# Patient Record
Sex: Female | Born: 1950 | Race: White | Hispanic: No | Smoking: Never smoker
Health system: Southern US, Community
[De-identification: ages and names within clinical notes are randomized; demographics above are authoritative.]

## PROBLEM LIST (undated history)

## (undated) DIAGNOSIS — R569 Unspecified convulsions: Secondary | ICD-10-CM

## (undated) DIAGNOSIS — E785 Hyperlipidemia, unspecified: Secondary | ICD-10-CM

## (undated) DIAGNOSIS — K219 Gastro-esophageal reflux disease without esophagitis: Secondary | ICD-10-CM

## (undated) DIAGNOSIS — S0993XA Unspecified injury of face, initial encounter: Secondary | ICD-10-CM

## (undated) DIAGNOSIS — H903 Sensorineural hearing loss, bilateral: Secondary | ICD-10-CM

## (undated) DIAGNOSIS — N6019 Diffuse cystic mastopathy of unspecified breast: Secondary | ICD-10-CM

## (undated) DIAGNOSIS — K449 Diaphragmatic hernia without obstruction or gangrene: Secondary | ICD-10-CM

## (undated) HISTORY — DX: Unspecified convulsions: R56.9

## (undated) HISTORY — PX: BREAST LUMPECTOMY: SHX2

## (undated) HISTORY — DX: Hyperlipidemia, unspecified: E78.5

## (undated) HISTORY — DX: Diaphragmatic hernia without obstruction or gangrene: K44.9

## (undated) HISTORY — PX: ABDOMINAL HYSTERECTOMY: SHX81

## (undated) HISTORY — DX: Diffuse cystic mastopathy of unspecified breast: N60.19

## (undated) HISTORY — PX: CATARACT EXTRACTION, BILATERAL: SHX1313

## (undated) HISTORY — DX: Sensorineural hearing loss, bilateral: H90.3

## (undated) HISTORY — DX: Gastro-esophageal reflux disease without esophagitis: K21.9

## (undated) HISTORY — DX: Unspecified injury of face, initial encounter: S09.93XA

---

## 2004-09-24 HISTORY — PX: COLONOSCOPY W/ POLYPECTOMY: SHX1380

## 2004-11-28 ENCOUNTER — Ambulatory Visit: Payer: Self-pay | Admitting: Unknown Physician Specialty

## 2009-10-04 ENCOUNTER — Ambulatory Visit: Payer: Self-pay | Admitting: Ophthalmology

## 2010-07-10 ENCOUNTER — Ambulatory Visit: Payer: Self-pay | Admitting: Unknown Physician Specialty

## 2010-07-18 ENCOUNTER — Inpatient Hospital Stay: Payer: Self-pay | Admitting: Unknown Physician Specialty

## 2010-07-20 LAB — PATHOLOGY REPORT

## 2012-02-26 ENCOUNTER — Ambulatory Visit: Payer: Self-pay | Admitting: Ophthalmology

## 2013-06-11 ENCOUNTER — Emergency Department: Payer: Self-pay | Admitting: Emergency Medicine

## 2013-06-11 LAB — COMPREHENSIVE METABOLIC PANEL
Albumin: 3.5 g/dL (ref 3.4–5.0)
BUN: 5 mg/dL — ABNORMAL LOW (ref 7–18)
Bilirubin,Total: 0.3 mg/dL (ref 0.2–1.0)
Calcium, Total: 8.7 mg/dL (ref 8.5–10.1)
Creatinine: 0.61 mg/dL (ref 0.60–1.30)
EGFR (African American): >60
EGFR (Non-African Amer.): >60
Glucose: 123 mg/dL — ABNORMAL HIGH (ref 65–99)
Potassium: 3.7 mmol/L (ref 3.5–5.1)
SGOT(AST): 16 U/L (ref 15–37)
Total Protein: 6.8 g/dL (ref 6.4–8.2)

## 2013-06-11 LAB — TROPONIN I: Troponin-I: <0.02 ng/mL

## 2013-06-11 LAB — CBC
MCV: 85 fL (ref 80–100)
Platelet: 272 10*3/uL (ref 150–440)
RBC: 4.33 10*6/uL (ref 3.80–5.20)

## 2013-06-11 LAB — URINALYSIS, COMPLETE
Glucose,UR: NEGATIVE mg/dL (ref 0–75)
Ketone: NEGATIVE
Protein: NEGATIVE
Specific Gravity: 1.006 (ref 1.003–1.030)
WBC UR: <1 /HPF (ref 0–5)

## 2013-06-11 LAB — CK TOTAL AND CKMB (NOT AT ARMC): CK, Total: 126 U/L (ref 21–215)

## 2013-07-30 ENCOUNTER — Ambulatory Visit: Payer: Self-pay | Admitting: Family Medicine

## 2013-08-04 ENCOUNTER — Ambulatory Visit: Payer: Self-pay | Admitting: Family Medicine

## 2013-08-18 ENCOUNTER — Ambulatory Visit: Payer: Self-pay | Admitting: Family Medicine

## 2014-08-18 ENCOUNTER — Ambulatory Visit: Payer: Self-pay | Admitting: Family Medicine

## 2014-12-17 ENCOUNTER — Ambulatory Visit: Payer: Self-pay | Admitting: Neurology

## 2014-12-17 LAB — BASIC METABOLIC PANEL
Anion Gap: 7 (ref 7–16)
BUN: 11 mg/dL
CHLORIDE: 106 mmol/L
CO2: 27 mmol/L
CREATININE: 0.61 mg/dL
Calcium, Total: 9 mg/dL
Glucose: 112 mg/dL — ABNORMAL HIGH
Potassium: 4.1 mmol/L
Sodium: 140 mmol/L

## 2015-01-11 DIAGNOSIS — H903 Sensorineural hearing loss, bilateral: Secondary | ICD-10-CM | POA: Insufficient documentation

## 2015-01-11 DIAGNOSIS — H269 Unspecified cataract: Secondary | ICD-10-CM

## 2015-01-11 DIAGNOSIS — K449 Diaphragmatic hernia without obstruction or gangrene: Secondary | ICD-10-CM | POA: Insufficient documentation

## 2015-01-11 DIAGNOSIS — I10 Essential (primary) hypertension: Secondary | ICD-10-CM | POA: Insufficient documentation

## 2015-01-11 DIAGNOSIS — Z9842 Cataract extraction status, left eye: Secondary | ICD-10-CM

## 2015-01-11 DIAGNOSIS — Z9841 Cataract extraction status, right eye: Secondary | ICD-10-CM | POA: Insufficient documentation

## 2015-01-11 DIAGNOSIS — E785 Hyperlipidemia, unspecified: Secondary | ICD-10-CM

## 2015-01-16 NOTE — Op Note (Signed)
PATIENT NAME:  [name], Rosangelica S MR#:  [id number] DATE OF BIRTH:  1951-02-20  DATE OF PROCEDURE:  02/26/2012  PREOPERATIVE DIAGNOSIS: Visually significant cataract of the right eye.   POSTOPERATIVE DIAGNOSIS: Visually significant cataract of the right eye.   OPERATIVE PROCEDURE: Cataract extraction by phacoemulsification with implant of intraocular lens to right eye.   SURGEON: Galen ManilaWilliam Verdell Dykman, MD.   ANESTHESIA:  1. Managed anesthesia care.  2. Topical tetracaine drops followed by 2% Xylocaine jelly applied in the preoperative holding area.   COMPLICATIONS: None.   TECHNIQUE:  Stop and chop.   DESCRIPTION OF PROCEDURE: The patient was examined and consented in the preoperative holding area where the aforementioned topical anesthesia was applied to the right eye and then brought back to the Operating Room where the right eye was prepped and draped in the usual sterile ophthalmic fashion and a lid speculum was placed. A paracentesis was created with the side port blade and the anterior chamber was filled with viscoelastic. A near clear corneal incision was performed with the steel keratome. A continuous curvilinear capsulorrhexis was performed with a cystotome followed by the capsulorrhexis forceps. Hydrodissection and hydrodelineation were carried out with BSS on a blunt cannula. The lens was removed in a stop and chop technique and the remaining cortical material was removed with the irrigation-aspiration handpiece. The capsular bag was inflated with viscoelastic and the Technis ZCB00 20.5-diopter lens, serial number [id number] was placed in the capsular bag without complication. The remaining viscoelastic was removed from the eye with the irrigation-aspiration handpiece. The wounds were hydrated. The anterior chamber was flushed with Miostat and the eye was inflated to physiologic pressure. The wounds were found to be water tight. The eye was dressed with Vigamox. The patient was given protective  glasses to wear throughout the day and a shield with which to sleep tonight. The patient was also given drops with which to begin a drop regimen today and will follow-up with me in one day.   ____________________________ Jerilee FieldWilliam L. Vicky Mccanless, MD wlp:cms D: 02/26/2012 11:53:17 ET T: 02/26/2012 12:07:55 ET JOB#: [id number]  cc: Thelmer Legler L. Beula Joyner, MD, <Dictator> Jerilee FieldWILLIAM L Ravonda Brecheen MD ELECTRONICALLY SIGNED 02/27/2012 15:42

## 2015-05-13 ENCOUNTER — Ambulatory Visit (INDEPENDENT_AMBULATORY_CARE_PROVIDER_SITE_OTHER): Payer: Medicare Other | Admitting: Family Medicine

## 2015-05-13 ENCOUNTER — Encounter: Payer: Self-pay | Admitting: Family Medicine

## 2015-05-13 VITALS — BP 150/73 | HR 80 | Temp 99.1°F | Ht 63.0 in | Wt 176.0 lb

## 2015-05-13 DIAGNOSIS — J029 Acute pharyngitis, unspecified: Secondary | ICD-10-CM | POA: Insufficient documentation

## 2015-05-13 DIAGNOSIS — E785 Hyperlipidemia, unspecified: Secondary | ICD-10-CM | POA: Diagnosis not present

## 2015-05-13 DIAGNOSIS — R200 Anesthesia of skin: Secondary | ICD-10-CM

## 2015-05-13 DIAGNOSIS — R202 Paresthesia of skin: Secondary | ICD-10-CM | POA: Diagnosis not present

## 2015-05-13 DIAGNOSIS — I10 Essential (primary) hypertension: Secondary | ICD-10-CM

## 2015-05-13 DIAGNOSIS — H903 Sensorineural hearing loss, bilateral: Secondary | ICD-10-CM | POA: Diagnosis not present

## 2015-05-13 DIAGNOSIS — M79622 Pain in left upper arm: Secondary | ICD-10-CM

## 2015-05-13 NOTE — Assessment & Plan Note (Signed)
Check lipids fasting at f/u

## 2015-05-13 NOTE — Assessment & Plan Note (Signed)
Previous "catch" in left shoulder; might have arthritis; no discrete tendonitis appreciated; EKG done, unchanged from previous and symptoms do not mirror CAD/angina; will start with NCS/EMG and then consider further eval like shoulder imaging

## 2015-05-13 NOTE — Progress Notes (Signed)
BP 150/73 mmHg  Pulse 80  Temp(Src) 99.1 F (37.3 C)  Ht  (1.6 m)  Wt 176 lb (79.833 kg)  BMI 31.18 kg/m2  SpO2 97%   Subjective:    Patient ID: Stephanie Sexton, female    DOB: 04-04-1951, 64 y.o.   MRN: [id number]  HPI: Stephanie Sexton is a 64 y.o. female  Chief Complaint  Patient presents with  . Arm Pain    left arm x 1 month  . Sore Throat   She thinks she has a sinus infection; her nose is kind of stopped up; throat is a little sore; throat has been sore for three days; her daughter has had something, but forgot what it was; around 66 yo, 67 yo, and 64 yo Hurts to swallow; no fevers; no swollen glands Not blowing out much from the nose Little bit of cough from drainage She has been taking alka seltzer cold plus, vitamin C  For some time... She is wearing a magnetic bracelet; left hand would hurt and feel weak; was having weakness in the wrist for a few years; she does not know why the left wrist is weak; can mow the yard; uses push mower; she says weak, but then retracts that and says it's actually more pain; does not hurt to mow the yard; recently, she has numbness in her fingers for just a little, about a month or so; maybe 2nd and 3rd and 4th fingers; not the 5th fingers; thumb is not involved at all; grip is strong  About a month ago, she started to get pain along the back of the left arm, bought some cream and it just ached terrible; hurt worse than anything she has ever had; took advil and acetaminophen and that gave her relief; she did not take any yesterday, she'll get busy and forget; today it is just a little bit nagging; the pain can be debilitating; flexing the elbow and resting forearm above her head makes it feel better; no chest pain at all; no limited ROM but it does feel stiff in the left arm; used to catch when she would raise her left shoulder and arm, the catch is better, but the pain started after that catch happened  Relevant past medical, surgical,  family and social history reviewed and updated as indicated. Interim medical history since our last visit reviewed. Allergies and medications reviewed and updated.  Review of Systems  Constitutional: Negative for fever.  HENT: Positive for hearing loss (cannot hear as well as she used to hear; she has not had a hearing test; left = right), postnasal drip and sore throat. Negative for mouth sores, nosebleeds and trouble swallowing.   Respiratory: Negative for shortness of breath.   Cardiovascular: Negative for chest pain.  Gastrointestinal: Negative for nausea.  Skin: Negative for color change, pallor and rash.  Neurological: Positive for seizures (last seizure was July 3rd and 4th; saw neurologist and he increased the medicine on July 5th) and numbness (2nd, 3rd, 4th finger of left hand; she is right-handed). Negative for tremors and weakness.    Per HPI unless specifically indicated above     Objective:    BP 150/73 mmHg  Pulse 80  Temp(Src) 99.1 F (37.3 C)  Ht  (1.6 m)  Wt 176 lb (79.833 kg)  BMI 31.18 kg/m2  SpO2 97%  Wt Readings from Last 3 Encounters:  05/13/15 176 lb (79.833 kg)  10/28/14 177 lb (80.287 kg)    Physical  Exam  Constitutional: She appears well-developed and well-nourished. No distress.  HENT:  Head: Normocephalic and atraumatic.  Right Ear: No drainage. Tympanic membrane is not injected, not erythematous, not retracted and not bulging. No middle ear effusion.  Left Ear: No drainage. Tympanic membrane is injected and erythematous. Tympanic membrane is not scarred, not retracted and not bulging.  No middle ear effusion.  Nose: No rhinorrhea.  Mouth/Throat: Mucous membranes are not pale and not dry. Posterior oropharyngeal erythema present. No oropharyngeal exudate or posterior oropharyngeal edema.  Mild erythema/hyperemia of left drum  Eyes: EOM are normal. No scleral icterus.  Neck: No thyromegaly present.  Cardiovascular: Normal rate, regular rhythm  and normal heart sounds.   No murmur heard. Pulmonary/Chest: Effort normal and breath sounds normal. No respiratory distress. She has no wheezes.  Abdominal: Soft. Bowel sounds are normal. She exhibits no distension.  Musculoskeletal: Normal range of motion. She exhibits no edema.       Left shoulder: She exhibits normal range of motion, no swelling, no deformity and normal strength.       Left wrist: She exhibits normal range of motion, no tenderness, no bony tenderness, no swelling and no deformity.  Lymphadenopathy:    She has no cervical adenopathy.  Neurological: She is alert. She has normal strength. She displays no atrophy and no tremor. No sensory deficit. She exhibits normal muscle tone. She displays no seizure activity.  Skin: Skin is warm and dry. She is not diaphoretic. No pallor.  No palmar erythema  Psychiatric: She has a normal mood and affect. Her behavior is normal. Judgment and thought content normal.      Assessment & Plan:   Problem List Items Addressed This Visit      Cardiovascular and Mediastinum   Hypertension    Controlled today        Respiratory   Acute pharyngitis - Primary    Rapid strep was negative; throat culture pending; if grandchildren, other contacts get sick and her strep A comes back positive, they will need to be seen by their provider(s); rest, hydration, vit C, green tea      Relevant Orders   Strep Gp A Ag, IA W/Reflex     Nervous and Auditory   Sensorineural hearing loss of both ears    Evaluated by ENT January of 2016; she can return in the next few years if worsening        Other   Dyslipidemia    Check lipids fasting at f/u      Left upper arm pain    Previous "catch" in left shoulder; might have arthritis; no discrete tendonitis appreciated; EKG done, unchanged from previous and symptoms do not mirror CAD/angina; will start with NCS/EMG and then consider further eval like shoulder imaging      Relevant Orders   EKG  12-Lead (Completed)   Nerve conduction test   Numbness and tingling in left hand    Symptoms more suggestive of carpal tunnel than ulnar neuropathy, but not classic for CTS; will get nerve conduction studies to see if problem is median nerve or other; doubt thyroid or B12, but can check labs at f/u if nerve studies not helpful      Relevant Orders   EKG 12-Lead (Completed)   Nerve conduction test      Follow up plan: Return in about 3 weeks (around 06/03/2015) for fasting labs, visit.  An after-visit summary was printed and given to the patient at check-out.  Please  see the patient instructions which may contain other information and recommendations beyond what is mentioned above in the assessment and plan.

## 2015-05-13 NOTE — Addendum Note (Signed)
Addended byClaudine Mouton J on: 05/13/2015 02:43 PM   Modules accepted: SmartSet

## 2015-05-13 NOTE — Patient Instructions (Addendum)
Try to use PLAIN allergy or cold medicine without the decongestants when needed Avoid: phenylephrine, phenylpropanolamine, and pseudoephredine Coricidin is consider safer Try vitamin C (orange juice if not diabetic or vitamin C tablets) and drink green tea to help your immune system during your illness Get plenty of rest and hydration We'll contact you about the throat culture We'll get nerve conduction studies and then go from there If you have not heard anything from my staff in a week about any orders/referrals/studies from today, please contact us here to follow-up [phone] Okay to use acetaminophen Try turmeric as a natural anti-inflammatory (for pain and arthritis). It comes in capsules where you buy aspirin and fish oil, but also as a spice where you buy pepper and garlic powder. Return after the nerve studies and we'll decide about further work-up

## 2015-05-13 NOTE — Assessment & Plan Note (Signed)
Rapid strep was negative; throat culture pending; if grandchildren, other contacts get sick and her strep A comes back positive, they will need to be seen by their provider(s); rest, hydration, vit C, green tea

## 2015-05-13 NOTE — Assessment & Plan Note (Signed)
Symptoms more suggestive of carpal tunnel than ulnar neuropathy, but not classic for CTS; will get nerve conduction studies to see if problem is median nerve or other; doubt thyroid or B12, but can check labs at f/u if nerve studies not helpful

## 2015-05-13 NOTE — Assessment & Plan Note (Signed)
Evaluated by ENT January of 2016; she can return in the next few years if worsening

## 2015-05-13 NOTE — Assessment & Plan Note (Signed)
Controlled today 

## 2015-05-16 LAB — STREP GP A AG, IA W/REFLEX: STREP A CULTURE: NEGATIVE

## 2015-06-06 ENCOUNTER — Encounter: Payer: Self-pay | Admitting: Family Medicine

## 2015-06-06 ENCOUNTER — Ambulatory Visit (INDEPENDENT_AMBULATORY_CARE_PROVIDER_SITE_OTHER): Payer: Medicare Other | Admitting: Family Medicine

## 2015-06-06 VITALS — BP 133/86 | HR 65 | Temp 98.6°F | Ht 63.1 in | Wt 171.4 lb

## 2015-06-06 DIAGNOSIS — Z23 Encounter for immunization: Secondary | ICD-10-CM | POA: Diagnosis not present

## 2015-06-06 DIAGNOSIS — M79622 Pain in left upper arm: Secondary | ICD-10-CM

## 2015-06-06 DIAGNOSIS — R200 Anesthesia of skin: Secondary | ICD-10-CM

## 2015-06-06 DIAGNOSIS — R739 Hyperglycemia, unspecified: Secondary | ICD-10-CM

## 2015-06-06 DIAGNOSIS — G5602 Carpal tunnel syndrome, left upper limb: Secondary | ICD-10-CM

## 2015-06-06 DIAGNOSIS — E785 Hyperlipidemia, unspecified: Secondary | ICD-10-CM | POA: Diagnosis not present

## 2015-06-06 DIAGNOSIS — I10 Essential (primary) hypertension: Secondary | ICD-10-CM | POA: Diagnosis not present

## 2015-06-06 DIAGNOSIS — R202 Paresthesia of skin: Secondary | ICD-10-CM | POA: Diagnosis not present

## 2015-06-06 MED ORDER — WRIST BRACE/LEFT MEDIUM MISC: Status: AC

## 2015-06-06 NOTE — Progress Notes (Signed)
BP 133/86 mmHg  Pulse 65  Temp(Src) 98.6 F (37 C)  Ht 5' 3.1" (1.603 m)  Wt 171 lb 6.4 oz (77.747 kg)  BMI 30.26 kg/m2  SpO2 97%   Subjective:    Patient ID: Stephanie Sexton, female    DOB: [birth year], 64 y.o.   MRN: [id number]  HPI: Stephanie Sexton is a 64 y.o. female  Chief Complaint  Patient presents with  . Follow-up    pt states she still has some tingling in her left hand but it comes and goes. Mainly in thumb, index and middle finger.   She is here for follow-up; she had a banana today did not realize fasting; she has high cholesterol; eats just one or two eggs a week; does eat some cheese, not much; does eat hamburgers occasionally; no sausage for years; no milk except on cereal, uses whole milk (organic); using spelt for bread, higher fiber  She took the medicine and says the upper arm is all better; the thumb and index finger and middle finger; tingles some times; movement does not hurt, does not help; does not wake her from sleep; she is right-handed; she has not talked with her neurologist about it; she has not had nerve studies yet; staying off of the computer makes it better; she has not tried any OTCs; not using brace; tingling does not last long; again, the left upper arm pain is gone  Flu shot today  She has lost 6 pounds; not eating as much sweets; no goal; trying to eat healthier  Last seizure was July 4th, none since; goes back to see neurologist next month, October 28th; she knows no driving for six months after seizures  Relevant past medical, surgical, family and social history reviewed and updated as indicated. Interim medical history since our last visit reviewed.  Mother and brother both had diabetes Allergies and medications reviewed and updated.  Review of Systems  Constitutional: Negative for fever and unexpected weight change.  HENT: Negative for sore throat.   Eyes: Negative for visual disturbance.  Respiratory: Negative for wheezing.    Cardiovascular: Negative for chest pain.  Gastrointestinal: Negative for blood in stool.  Endocrine: Negative for polydipsia.  Genitourinary: Negative for hematuria.  Musculoskeletal: Negative for joint swelling and arthralgias.  Skin:       No worrisome skin changes; cherry angiomas and SKs around neckline  Allergic/Immunologic: Negative for food allergies.  Neurological: Positive for numbness (see HPI).  Hematological: Negative for adenopathy. Does not bruise/bleed easily.  Psychiatric/Behavioral:       More short-tempered from medicine from seizures  Per HPI unless specifically indicated above     Objective:    BP 133/86 mmHg  Pulse 65  Temp(Src) 98.6 F (37 C)  Ht 5' 3.1" (1.603 m)  Wt 171 lb 6.4 oz (77.747 kg)  BMI 30.26 kg/m2  SpO2 97%  Wt Readings from Last 3 Encounters:  06/06/15 171 lb 6.4 oz (77.747 kg)  05/13/15 176 lb (79.833 kg)  10/28/14 177 lb (80.287 kg)    Physical Exam  Constitutional: She appears well-developed and well-nourished. No distress.  HENT:  Head: Normocephalic and atraumatic.  Eyes: EOM are normal. No scleral icterus.  Neck: No thyromegaly present.  Cardiovascular: Normal rate, regular rhythm and normal heart sounds.   No murmur heard. Pulmonary/Chest: Effort normal and breath sounds normal. No respiratory distress. She has no wheezes.  Abdominal: Soft. Bowel sounds are normal. She exhibits no distension.  Musculoskeletal: Normal range of  motion. She exhibits no edema.  Neurological: She is alert. She exhibits normal muscle tone. Gait normal.  Episodic jerking movements of upper extremities; negative Phalen's and Tinel's test, but patient reported tingling shortly afterwards in the left hand along median nerve distribution, but not during the actual tests  Skin: Skin is warm and dry. No bruising noted. She is not diaphoretic. No pallor.  Scattered small cherry angiomas and brown keratotic papules around neckline  Psychiatric: She has a  normal mood and affect. Her behavior is normal. Judgment and thought content normal.  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    See AVS, educational info given; try brace; limit repetitive movements; considered EMG/NCS but patient opted to hold on that for now; she'll talk to her neurologist next month      Relevant Orders   TSH   Vitamin B12     Other   Dyslipidemia - Primary    Check fasting lipids later this week or next week; limit fried foods, simple carbs, sweets      Relevant Orders   Lipid Panel w/o Chol/HDL Ratio   Left upper arm pain    resolved      Numbness and tingling in left hand    Suspect carpal tunnel based on history; order nerve studies and try brace and have her take to neurologist      Hyperglycemia    Blood sugar in January was 124; recheck that along with an A1C later this week or next truly fasting; positive family hx of diabetes; praised weight loss and healthier eating      Relevant Orders   Comprehensive metabolic panel   Hgb A1c w/o eAG    Other Visit Diagnoses    Immunization due        flu vaccine given today    Relevant Orders    Flu vaccine greater than or equal to 3yo preservative free IM (Completed)       Follow up plan: Return in about 6 months (around 12/04/2015) for cholesterol and blood pressure; Medicare Wellness whenever due.  Orders Placed This Encounter  Procedures  . Flu vaccine greater than or equal to 3yo preservative free IM  . Comprehensive metabolic panel  . Hgb A1c w/o eAG  . Lipid Panel w/o Chol/HDL Ratio  . TSH  . Vitamin B12

## 2015-06-06 NOTE — Assessment & Plan Note (Signed)
See AVS, educational info given; try brace; limit repetitive movements; considered EMG/NCS but patient opted to hold on that for now; she'll talk to her neurologist next month

## 2015-06-06 NOTE — Assessment & Plan Note (Addendum)
Check fasting lipids later this week or next week; limit fried foods, simple carbs, sweets

## 2015-06-06 NOTE — Assessment & Plan Note (Addendum)
Blood sugar in January was 124; recheck that along with an A1C later this week or next truly fasting; positive family hx of diabetes; praised weight loss and healthier eating

## 2015-06-06 NOTE — Patient Instructions (Addendum)
Do consider cutting down the fat content of your milk Wear the brace as often as possible and limit repetitive motions with the left hand and wrist Talk with your neurologist if not improving with conservative therapy Return for labs next week at your convenience (okay to have medicines and water or other calorie-free beverages)   Carpal Tunnel Syndrome The carpal tunnel is a narrow area located on the palm side of your wrist. The tunnel is formed by the wrist bones and ligaments. Nerves, blood vessels, and tendons pass through the carpal tunnel. Repeated wrist motion or certain diseases may cause swelling within the tunnel. This swelling pinches the main nerve in the wrist (median nerve) and causes the painful hand and arm condition called carpal tunnel syndrome. CAUSES   Repeated wrist motions.  Wrist injuries.  Certain diseases like arthritis, diabetes, alcoholism, hyperthyroidism, and kidney failure.  Obesity.  Pregnancy. SYMPTOMS   A "pins and needles" feeling in your fingers or hand, especially in your thumb, index and middle fingers.  Tingling or numbness in your fingers or hand.  An aching feeling in your entire arm, especially when your wrist and elbow are bent for long periods of time.  Wrist pain that goes up your arm to your shoulder.  Pain that goes down into your palm or fingers.  A weak feeling in your hands. DIAGNOSIS  Your health care provider will take your history and perform a physical exam. An electromyography test may be needed. This test measures electrical signals sent out by your nerves into the muscles. The electrical signals are usually slowed by carpal tunnel syndrome. You may also need X-rays. TREATMENT  Carpal tunnel syndrome may clear up by itself. Your health care provider may recommend a wrist splint or medicine such as a nonsteroidal anti-inflammatory medicine. Cortisone injections may help. Sometimes, surgery may be needed to free the pinched  nerve.  HOME CARE INSTRUCTIONS   Take all medicine as directed by your health care provider. Only take over-the-counter or prescription medicines for pain, discomfort, or fever as directed by your health care provider.  If you were given a splint to keep your wrist from bending, wear it as directed. It is important to wear the splint at night. Wear the splint for as long as you have pain or numbness in your hand, arm, or wrist. This may take 1 to 2 months.  Rest your wrist from any activity that may be causing your pain. If your symptoms are work-related, you may need to talk to your employer about changing to a job that does not require using your wrist.  Put ice on your wrist after long periods of wrist activity.  Put ice in a plastic bag.  Place a towel between your skin and the bag.  Leave the ice on for 15-20 minutes, 03-04 times a day.  Keep all follow-up visits as directed by your health care provider. This includes any orthopedic referrals, physical therapy, and rehabilitation. Any delay in getting necessary care could result in a delay or failure of your condition to heal. SEEK IMMEDIATE MEDICAL CARE IF:   You have new, unexplained symptoms.  Your symptoms get worse and are not helped or controlled with medicines. MAKE SURE YOU:   Understand these instructions.  Will watch your condition.  Will get help right away if you are not doing well or get worse. Document Released: 09/07/2000 Document Revised: 01/25/2014 Document Reviewed: 07/27/2011 [facility] Patient Information 2015 [place], [place]. This information is not intended  to replace advice given to you by your health care provider. Make sure you discuss any questions you have with your health care provider.  DASH Eating Plan DASH stands for "Dietary Approaches to Stop Hypertension." The DASH eating plan is a healthy eating plan that has been shown to reduce high blood pressure (hypertension). Additional health benefits may  include reducing the risk of type 2 diabetes mellitus, heart disease, and stroke. The DASH eating plan may also help with weight loss. WHAT DO I NEED TO KNOW ABOUT THE DASH EATING PLAN? For the DASH eating plan, you will follow these general guidelines:  Choose foods with a percent daily value for sodium of less than 5% (as listed on the food label).  Use salt-free seasonings or herbs instead of table salt or sea salt.  Check with your health care provider or pharmacist before using salt substitutes.  Eat lower-sodium products, often labeled as "lower sodium" or "no salt added."  Eat fresh foods.  Eat more vegetables, fruits, and low-fat dairy products.  Choose whole grains. Look for the word "whole" as the first word in the ingredient list.  Choose fish and skinless chicken or [place] more often than red meat. Limit fish, poultry, and meat to 6 oz (170 g) each day.  Limit sweets, desserts, sugars, and sugary drinks.  Choose heart-healthy fats.  Limit cheese to 1 oz (28 g) per day.  Eat more home-cooked food and less restaurant, buffet, and fast food.  Limit fried foods.  Cook foods using methods other than frying.  Limit canned vegetables. If you do use them, rinse them well to decrease the sodium.  When eating at a restaurant, ask that your food be prepared with less salt, or no salt if possible. WHAT FOODS CAN I EAT? Seek help from a dietitian for individual calorie needs. Grains Whole grain or whole wheat bread. Brown rice. Whole grain or whole wheat pasta. Quinoa, bulgur, and whole grain cereals. Low-sodium cereals. Corn or whole wheat flour tortillas. Whole grain cornbread. Whole grain crackers. Low-sodium crackers. Vegetables Fresh or frozen vegetables (raw, steamed, roasted, or grilled). Low-sodium or reduced-sodium tomato and vegetable juices. Low-sodium or reduced-sodium tomato sauce and paste. Low-sodium or reduced-sodium canned vegetables.  Fruits All fresh,  canned (in natural juice), or frozen fruits. Meat and Other Protein Products Ground beef (85% or leaner), grass-fed beef, or beef trimmed of fat. Skinless chicken or [place]. Ground chicken or [place]. Pork trimmed of fat. All fish and seafood. Eggs. Dried beans, peas, or lentils. Unsalted nuts and seeds. Unsalted canned beans. Dairy Low-fat dairy products, such as skim or 1% milk, 2% or reduced-fat cheeses, low-fat ricotta or cottage cheese, or plain low-fat yogurt. Low-sodium or reduced-sodium cheeses. Fats and Oils Tub margarines without trans fats. Light or reduced-fat mayonnaise and salad dressings (reduced sodium). Avocado. Safflower, olive, or canola oils. Natural peanut or almond butter. Other Unsalted popcorn and pretzels. The items listed above may not be a complete list of recommended foods or beverages. Contact your dietitian for more options. WHAT FOODS ARE NOT RECOMMENDED? Grains White bread. White pasta. White rice. Refined cornbread. Bagels and croissants. Crackers that contain trans fat. Vegetables Creamed or fried vegetables. Vegetables in a cheese sauce. Regular canned vegetables. Regular canned tomato sauce and paste. Regular tomato and vegetable juices. Fruits Dried fruits. Canned fruit in light or heavy syrup. Fruit juice. Meat and Other Protein Products Fatty cuts of meat. Ribs, chicken wings, bacon, sausage, bologna, salami, chitterlings, fatback, hot dogs, bratwurst, and  packaged luncheon meats. Salted nuts and seeds. Canned beans with salt. Dairy Whole or 2% milk, cream, half-and-half, and cream cheese. Whole-fat or sweetened yogurt. Full-fat cheeses or blue cheese. Nondairy creamers and whipped toppings. Processed cheese, cheese spreads, or cheese curds. Condiments Onion and garlic salt, seasoned salt, table salt, and sea salt. Canned and packaged gravies. Worcestershire sauce. Tartar sauce. Barbecue sauce. Teriyaki sauce. Soy sauce, including reduced sodium. Steak  sauce. Fish sauce. Oyster sauce. Cocktail sauce. Horseradish. Ketchup and mustard. Meat flavorings and tenderizers. Bouillon cubes. Hot sauce. Tabasco sauce. Marinades. Taco seasonings. Relishes. Fats and Oils Butter, stick margarine, lard, shortening, ghee, and bacon fat. Coconut, palm kernel, or palm oils. Regular salad dressings. Other Pickles and olives. Salted popcorn and pretzels. The items listed above may not be a complete list of foods and beverages to avoid. Contact your dietitian for more information. WHERE CAN I FIND MORE INFORMATION? National Heart, Lung, and Blood Institute: [employer] Document Released: 08/30/2011 Document Revised: 01/25/2014 Document Reviewed: 07/15/2013 [facility] Patient Information 2015 [place], [place]. This information is not intended to replace advice given to you by your health care provider. Make sure you discuss any questions you have with your health care provider.

## 2015-06-06 NOTE — Assessment & Plan Note (Signed)
resolved 

## 2015-06-06 NOTE — Assessment & Plan Note (Signed)
Controlled today; limit salt, try to follow DASH guidelines, see AVS

## 2015-06-06 NOTE — Assessment & Plan Note (Signed)
Suspect carpal tunnel based on history; order nerve studies and try brace and have her take to neurologist

## 2015-07-12 ENCOUNTER — Telehealth: Payer: Self-pay

## 2015-07-12 NOTE — Telephone Encounter (Signed)
Called patient about her eye exam and colon cancer screening. She said that she knows she needs an eye exam, but hasn't been able to because she can't drive. But she has an appointment within the next month and she said that hopefully she'll get her license back and be able to to make her eye exam. [facility] Patient also said she wanted to do the cologuard instead of a colonoscopy.

## 2015-08-11 ENCOUNTER — Telehealth: Payer: Self-pay

## 2015-08-11 NOTE — Telephone Encounter (Signed)
Patient notified, she is still not allowed to drive due to her seizures so she has not been able to get in for labs since she is depending on a driver. She will be cleared after the first of the year and will come in then.

## 2015-08-12 ENCOUNTER — Telehealth: Payer: Self-pay | Admitting: Family Medicine

## 2015-08-12 NOTE — Telephone Encounter (Signed)
-----   Message from Elby ShowersAmy J Workman, [place] sent at 08/11/2015  5:04 PM EST ----- Regarding: RE: Please remind patient she is overdue for fasting labs; thank you She has not forgotten, but since she can't drive due to her seizures, she is stuck depending on people to take her. She wants to wait until after Jan 4th because then she'll be allowed to drive again. ----- Message -----    From: Kerman PasseyMelinda P Lada, MD    Sent: 08/10/2015   6:10 PM      To: Amy Charlyne PetrinJ Workman, CMA Subject: Please remind patient she is overdue for fas#

## 2015-12-06 ENCOUNTER — Ambulatory Visit: Payer: Medicare Other | Admitting: Family Medicine

## 2015-12-07 ENCOUNTER — Ambulatory Visit (INDEPENDENT_AMBULATORY_CARE_PROVIDER_SITE_OTHER): Payer: Medicare Other | Admitting: Family Medicine

## 2015-12-07 ENCOUNTER — Encounter: Payer: Self-pay | Admitting: Family Medicine

## 2015-12-07 VITALS — BP 116/69 | HR 66 | Temp 98.3°F | Ht 63.5 in | Wt 172.0 lb

## 2015-12-07 DIAGNOSIS — Z5181 Encounter for therapeutic drug level monitoring: Secondary | ICD-10-CM

## 2015-12-07 DIAGNOSIS — E538 Deficiency of other specified B group vitamins: Secondary | ICD-10-CM | POA: Diagnosis not present

## 2015-12-07 DIAGNOSIS — Z23 Encounter for immunization: Secondary | ICD-10-CM | POA: Diagnosis not present

## 2015-12-07 DIAGNOSIS — Z1231 Encounter for screening mammogram for malignant neoplasm of breast: Secondary | ICD-10-CM

## 2015-12-07 DIAGNOSIS — G5602 Carpal tunnel syndrome, left upper limb: Secondary | ICD-10-CM

## 2015-12-07 DIAGNOSIS — Z9842 Cataract extraction status, left eye: Secondary | ICD-10-CM

## 2015-12-07 DIAGNOSIS — E785 Hyperlipidemia, unspecified: Secondary | ICD-10-CM | POA: Diagnosis not present

## 2015-12-07 DIAGNOSIS — Z9841 Cataract extraction status, right eye: Secondary | ICD-10-CM | POA: Diagnosis not present

## 2015-12-07 DIAGNOSIS — R739 Hyperglycemia, unspecified: Secondary | ICD-10-CM | POA: Diagnosis not present

## 2015-12-07 DIAGNOSIS — K449 Diaphragmatic hernia without obstruction or gangrene: Secondary | ICD-10-CM

## 2015-12-07 DIAGNOSIS — I10 Essential (primary) hypertension: Secondary | ICD-10-CM

## 2015-12-07 NOTE — Assessment & Plan Note (Signed)
Continue SL B12 and check labs today

## 2015-12-07 NOTE — Assessment & Plan Note (Signed)
Limit eggs and cheese and saturated fats; check lipids today

## 2015-12-07 NOTE — Assessment & Plan Note (Signed)
Not causing significant symptoms; try to lose modest weight

## 2015-12-07 NOTE — Assessment & Plan Note (Signed)
Continue brace, good results

## 2015-12-07 NOTE — Assessment & Plan Note (Signed)
She'll call to make her own appt for routine f/u with eye doctor

## 2015-12-07 NOTE — Patient Instructions (Signed)
Keep up the great job limiting salt Try to limit your sweets and maybe lose about 10 pounds before our next visit We'll get labs today If you have not heard anything from my staff in a week about any orders/referrals/studies from today, please contact us here to follow-up [phone] You have received the Pneumovax vaccine (PPSV-23 and you will not need another booster of this for the rest of your life per current ACIP guidelines Return in 6 months

## 2015-12-07 NOTE — Assessment & Plan Note (Signed)
Check A1c; work on modest weight loss; cut back on sweets

## 2015-12-07 NOTE — Assessment & Plan Note (Signed)
Well-controlled today; continue same regimen; limit sodium, try to follow DASH guidelines

## 2015-12-07 NOTE — Assessment & Plan Note (Deleted)
She'll make her own appt to see eye doctor

## 2015-12-07 NOTE — Progress Notes (Signed)
BP 116/69 mmHg  Pulse 66  Temp(Src) 98.3 F (36.8 C)  Ht 5' 3.5" (1.613 m)  Wt 172 lb (78.019 kg)  BMI 29.99 kg/m2  SpO2 96%   Subjective:    Patient ID: Stephanie Sexton, female    DOB: 27-Dec-1950, 65 y.o.   MRN: [id number]  HPI: Stephanie Sexton is a 65 y.o. female  Chief Complaint  Patient presents with  . Hypertension    follow up and labs  . Hyperlipidemia    follow up and labs  . Referral    needs referral for colonoscopy; order placed for mammogram  . Immunizations    she is interested in the PPSV 23 vaccine   She is interested in getting the PPSV-23 today; she declines the Hep C testing (after counseling) Driving again, no seizures since March 28, 2015; sees neurologist Hypertension; well-controlled today; not checking away from doctor; not adding much salt, not reading labels for sodium content; husband had chronic HTN so she learned over the years to cook with less salt High cholesterol; not many eggs; sometimes occasional cheese, cheeseburgers occasionally; 2-3x a month; eats more chicken or [place]; plenty of fruits and veggies Carpal tunnel on the left; much better with brace; sleeps with it every night Prediabetes; mother had diabetes; few aunts, sister; pt will try to cut back on sweets She was due for labs 6 months ago and she says she couldn't come for labs because she says she couldn't drive; she knew she was supposed to come She is taking B12 for deficiency, discovered by Dr. Malvin Johns  Relevant past medical, surgical, family and social history reviewed and updated as indicated. Interim medical history since our last visit reviewed. Allergies and medications reviewed and updated.  Review of Systems  Gastrointestinal:       Eating yogurt and seldom has reflux; treated just occasionally with Tums; off of the acid medicine   Per HPI unless specifically indicated above     Objective:    BP 116/69 mmHg  Pulse 66  Temp(Src) 98.3 F (36.8 C)  Ht 5' 3.5"  (1.613 m)  Wt 172 lb (78.019 kg)  BMI 29.99 kg/m2  SpO2 96%  Wt Readings from Last 3 Encounters:  12/07/15 172 lb (78.019 kg)  06/06/15 171 lb 6.4 oz (77.747 kg)  05/13/15 176 lb (79.833 kg)    Physical Exam  Constitutional: She appears well-developed and well-nourished. No distress.  HENT:  Head: Normocephalic and atraumatic.  Eyes: EOM are normal. No scleral icterus.  Neck: No thyromegaly present.  Cardiovascular: Normal rate, regular rhythm and normal heart sounds.   No murmur heard. Pulmonary/Chest: Effort normal and breath sounds normal. No respiratory distress. She has no wheezes.  Abdominal: Soft. Bowel sounds are normal. She exhibits no distension.  Musculoskeletal: Normal range of motion. She exhibits no edema.  Neurological: She is alert. She exhibits normal muscle tone.  Grip 5/5; no thenar wasting  Skin: Skin is warm and dry. She is not diaphoretic. No pallor.  Psychiatric: She has a normal mood and affect. Her behavior is normal. Judgment and thought content normal.    Results for orders placed or performed in visit on 05/13/15  Strep Gp A Ag, IA W/Reflex  Result Value Ref Range   Strep A Culture Negative       Assessment & Plan:   Problem List Items Addressed This Visit      Cardiovascular and Mediastinum   Hypertension - Primary    Well-controlled today; continue  same regimen; limit sodium, try to follow DASH guidelines        Respiratory   Hiatal hernia    Not causing significant symptoms; try to lose modest weight        Digestive   Vitamin B12 deficiency    Continue SL B12 and check labs today      Relevant Orders   Vitamin B12     Nervous and Auditory   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    Continue brace, good results      Relevant Medications   levETIRAcetam (KEPPRA) 750 MG tablet     Other   H/O bilateral cataract extraction    She'll call to make her own appt for routine f/u with eye doctor      Dyslipidemia    Limit  eggs and cheese and saturated fats; check lipids today      Relevant Orders   Lipid Panel w/o Chol/HDL Ratio   Hyperglycemia    Check A1c; work on modest weight loss; cut back on sweets      Relevant Orders   Hgb A1c w/o eAG   Need for Streptococcus pneumoniae vaccination   Relevant Orders   Pneumococcal polysaccharide vaccine 23-valent greater than or equal to 2yo subcutaneous/IM (Completed)   Medication monitoring encounter   Relevant Orders   Comprehensive metabolic panel   CBC with Differential/Platelet    Other Visit Diagnoses    Visit for screening mammogram        Relevant Orders    MM SCREENING BREAST TOMO BILATERAL       Follow up plan: Return in about 6 months (around 06/08/2016) for visit and fasting labs.  Orders Placed This Encounter  Procedures  . MM SCREENING BREAST TOMO BILATERAL  . Pneumococcal polysaccharide vaccine 23-valent greater than or equal to 2yo subcutaneous/IM  . Lipid Panel w/o Chol/HDL Ratio  . Hgb A1c w/o eAG  . Comprehensive metabolic panel  . Vitamin B12  . CBC with Differential/Platelet   An after-visit summary was printed and given to the patient at check-out.  Please see the patient instructions which may contain other information and recommendations beyond what is mentioned above in the assessment and plan.

## 2015-12-08 LAB — CBC WITH DIFFERENTIAL/PLATELET
Basophils Absolute: 0 10*3/uL (ref 0.0–0.2)
Basos: 0 %
EOS (ABSOLUTE): 0.2 10*3/uL (ref 0.0–0.4)
EOS: 3 %
Hematocrit: 38.2 % (ref 34.0–46.6)
Hemoglobin: 12.4 g/dL (ref 11.1–15.9)
IMMATURE GRANULOCYTES: 0 %
Immature Grans (Abs): 0 10*3/uL (ref 0.0–0.1)
LYMPHS ABS: 1.6 10*3/uL (ref 0.7–3.1)
LYMPHS: 22 %
MCH: 27.7 pg (ref 26.6–33.0)
MCHC: 32.5 g/dL (ref 31.5–35.7)
MCV: 86 fL (ref 79–97)
MONOCYTES: 8 %
MONOS ABS: 0.6 10*3/uL (ref 0.1–0.9)
Neutrophils Absolute: 4.9 10*3/uL (ref 1.4–7.0)
Neutrophils: 67 %
PLATELETS: 266 10*3/uL (ref 150–379)
RBC: 4.47 x10E6/uL (ref 3.77–5.28)
RDW: 14.8 % (ref 12.3–15.4)
WBC: 7.3 10*3/uL (ref 3.4–10.8)

## 2015-12-08 LAB — COMPREHENSIVE METABOLIC PANEL WITH GFR
ALT: 8 [IU]/L (ref 0–32)
AST: 9 [IU]/L (ref 0–40)
Albumin/Globulin Ratio: 1.4 (ref 1.2–2.2)
Albumin: 3.9 g/dL (ref 3.6–4.8)
Alkaline Phosphatase: 96 [IU]/L (ref 39–117)
BUN/Creatinine Ratio: 15 (ref 11–26)
BUN: 9 mg/dL (ref 8–27)
Bilirubin Total: 0.3 mg/dL (ref 0.0–1.2)
CO2: 24 mmol/L (ref 18–29)
Calcium: 9.9 mg/dL (ref 8.7–10.3)
Chloride: 101 mmol/L (ref 96–106)
Creatinine, Ser: 0.6 mg/dL (ref 0.57–1.00)
GFR calc Af Amer: 110 mL/min/{1.73_m2}
GFR calc non Af Amer: 95 mL/min/{1.73_m2}
Globulin, Total: 2.7 g/dL (ref 1.5–4.5)
Glucose: 98 mg/dL (ref 65–99)
Potassium: 4.1 mmol/L (ref 3.5–5.2)
Sodium: 142 mmol/L (ref 134–144)
Total Protein: 6.6 g/dL (ref 6.0–8.5)

## 2015-12-08 LAB — LIPID PANEL W/O CHOL/HDL RATIO
Cholesterol, Total: 179 mg/dL (ref 100–199)
HDL: 41 mg/dL
LDL Calculated: 86 mg/dL (ref 0–99)
Triglycerides: 258 mg/dL — ABNORMAL HIGH (ref 0–149)
VLDL Cholesterol Cal: 52 mg/dL — ABNORMAL HIGH (ref 5–40)

## 2015-12-08 LAB — HGB A1C W/O EAG: Hgb A1c MFr Bld: 6 % — ABNORMAL HIGH (ref 4.8–5.6)

## 2015-12-08 LAB — VITAMIN B12

## 2016-06-13 ENCOUNTER — Ambulatory Visit
Admission: EM | Admit: 2016-06-13 | Discharge: 2016-06-13 | Disposition: A | Discharge status: Home / Self Care | Payer: Medicare Other | Attending: Family Medicine | Admitting: Family Medicine

## 2016-06-13 DIAGNOSIS — J0100 Acute maxillary sinusitis, unspecified: Secondary | ICD-10-CM | POA: Diagnosis not present

## 2016-06-13 MED ORDER — AMOXICILLIN 875 MG PO TABS: 875.0000 mg | ORAL_TABLET | Freq: Two times a day (BID) | ORAL | 0 refills | Status: AC

## 2016-06-13 NOTE — ED Provider Notes (Signed)
[facility]    CSN: [id number] Arrival date & time: 06/13/16  1133  First Provider Contact:  None       History   Chief Complaint Chief Complaint  Patient presents with  . Nasal Congestion    cough    HPI Stephanie Sexton is a 65 y.o. female.   The history is provided by the patient.  URI  Presenting symptoms: congestion, cough and rhinorrhea   Presenting symptoms: no fever   Severity:  Moderate Onset quality:  Sudden Duration:  1 week Timing:  Constant Progression:  Worsening Chronicity:  New Relieved by:  Nothing Ineffective treatments:  OTC medications Risk factors: not elderly, no chronic cardiac disease, no chronic kidney disease, no chronic respiratory disease, no diabetes mellitus, no immunosuppression, no recent illness, no recent travel and no sick contacts     Past Medical History:  Diagnosis Date  . Facial injury   . Fibrocystic breast changes   . GERD (gastroesophageal reflux disease)   . Hiatal hernia   . Hyperlipidemia   . Seizures (HCC)    managed by Dr. Sherryll Burger  . Sensorineural hearing loss of both ears     Patient Active Problem List   Diagnosis Date Noted  . Need for Streptococcus pneumoniae vaccination 12/07/2015  . Vitamin B12 deficiency 12/07/2015  . Medication monitoring encounter 12/07/2015  . Hyperglycemia 06/06/2015  . 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 Left upper arm pain 05/13/2015  . Numbness and tingling in left hand 05/13/2015  . H/O bilateral cataract extraction 01/11/2015  . Hypertension 01/11/2015  . Dyslipidemia 01/11/2015  . Hiatal hernia   . Sensorineural hearing loss of both ears     Past Surgical History:  Procedure Laterality Date  . ABDOMINAL HYSTERECTOMY     Total  . BREAST LUMPECTOMY Left   . CATARACT EXTRACTION, BILATERAL    . COLONOSCOPY W/ POLYPECTOMY  2006   Tubular Adenoma     OB History    No data available       Home Medications    Prior to Admission  medications   Medication Sig Start Date End Date Taking? Authorizing Provider  Apoaequorin (PREVAGEN) 10 MG CAPS Take by mouth daily.   Yes Historical Provider, MD  Ascorbic Acid (VITAMIN C) 100 MG tablet Take 500 mg by mouth daily.    Yes Historical Provider, MD  aspirin EC 81 MG tablet Take 81 mg by mouth daily.   Yes Historical Provider, MD  BIOTIN 5000 PO Take 5,000 mg by mouth daily.   Yes Historical Provider, MD  Cholecalciferol (VITAMIN D3 ADULT GUMMIES PO) Take by mouth daily.   Yes Historical Provider, MD  levETIRAcetam (KEPPRA) 750 MG tablet Take 750 mg by mouth 2 (two) times daily. 12/01/15  Yes Historical Provider, MD  Misc Natural Products (TURMERIC CURCUMIN) CAPS Take 1 capsule by mouth daily.   Yes Historical Provider, MD  Omega-3 Fatty Acids (FISH OIL) 1000 MG CAPS Take by mouth 2 (two) times daily.    Yes Historical Provider, MD  vitamin B-12 (CYANOCOBALAMIN) 100 MCG tablet Take 1,000 mcg by mouth 2 (two) times daily.    Yes Historical Provider, MD  amoxicillin (AMOXIL) 875 MG tablet Take 1 tablet (875 mg total) by mouth 2 (two) times daily. 06/13/16   Payton Mccallum, MD  Elastic Bandages & Supports (WRIST BRACE/LEFT MEDIUM) MISC Wear brace on the left wrist/hand daily while awake 06/06/15   Kerman Passey, MD  Family History Family History  Problem Relation Age of Onset  . Diabetes Mother   . Thyroid disease Mother   . Hypertension Mother   . Glaucoma Father   . Heart disease Brother   . Diabetes Brother   . Hypertension Brother   . Cancer Sister     colon and breast  . Thyroid disease Sister   . Seizures Paternal Aunt     Social History Social History  Substance Use Topics  . Smoking status: Never Smoker  . Smokeless tobacco: Never Used  . Alcohol use No     Allergies   Sulfa antibiotics   Review of Systems Review of Systems  Constitutional: Negative for fever.  HENT: Positive for congestion and rhinorrhea.   Respiratory: Positive for cough.       Physical Exam Triage Vital Signs ED Triage Vitals  Enc Vitals Group     BP 06/13/16 1224 130/70     Pulse Rate 06/13/16 1224 66     Resp 06/13/16 1224 18     Temp 06/13/16 1224 97.9 F (36.6 C)     Temp Source 06/13/16 1224 Oral     SpO2 06/13/16 1224 98 %     Weight 06/13/16 1224 180 lb (81.6 kg)     Height 06/13/16 1224 5\' 3"  (1.6 m)     Head Circumference --      Peak Flow --      Pain Score 06/13/16 1226 0     Pain Loc --      Pain Edu? --      Excl. in GC? --    No data found.   Updated Vital Signs BP 130/70 (BP Location: Left Arm)   Pulse 66   Temp 97.9 F (36.6 C) (Oral)   Resp 18   Ht 5\' 3"  (1.6 m)   Wt 180 lb (81.6 kg)   SpO2 98%   BMI 31.89 kg/m   Visual Acuity Right Eye Distance:   Left Eye Distance:   Bilateral Distance:    Right Eye Near:   Left Eye Near:    Bilateral Near:     Physical Exam  Constitutional: She appears well-developed and well-nourished. No distress.  HENT:  Head: Normocephalic and atraumatic.  Right Ear: Tympanic membrane, external ear and ear canal normal.  Left Ear: Tympanic membrane, external ear and ear canal normal.  Nose: Mucosal edema and rhinorrhea present. No nose lacerations, sinus tenderness, nasal deformity, septal deviation or nasal septal hematoma. No epistaxis.  No foreign bodies. Right sinus exhibits maxillary sinus tenderness and frontal sinus tenderness. Left sinus exhibits maxillary sinus tenderness and frontal sinus tenderness.  Mouth/Throat: Uvula is midline, oropharynx is clear and moist and mucous membranes are normal. No oropharyngeal exudate.  Eyes: Conjunctivae and EOM are normal. Pupils are equal, round, and reactive to light. Right eye exhibits no discharge. Left eye exhibits no discharge. No scleral icterus.  Neck: Normal range of motion. Neck supple. No thyromegaly present.  Cardiovascular: Normal rate, regular rhythm and normal heart sounds.   Pulmonary/Chest: Effort normal and breath sounds  normal. No respiratory distress. She has no wheezes. She has no rales.  Lymphadenopathy:    She has no cervical adenopathy.  Skin: She is not diaphoretic.  Nursing note and vitals reviewed.    UC Treatments / Results  Labs (all labs ordered are listed, but only abnormal results are displayed) Labs Reviewed - No data to display  EKG  EKG Interpretation None  Radiology No results found.  Procedures Procedures (including critical care time)  Medications Ordered in UC Medications - No data to display   Initial Impression / Assessment and Plan / UC Course  I have reviewed the triage vital signs and the nursing notes.  Pertinent labs & imaging results that were available during my care of the patient were reviewed by me and considered in my medical decision making (see chart for details).  Clinical Course      Final Clinical Impressions(s) / UC Diagnoses   Final diagnoses:  Acute maxillary sinusitis, recurrence not specified    New Prescriptions Discharge Medication List as of 06/13/2016 12:46 PM    START taking these medications   Details  amoxicillin (AMOXIL) 875 MG tablet Take 1 tablet (875 mg total) by mouth 2 (two) times daily., Starting Wed 06/13/2016, Normal       1. diagnosis reviewed with patient 2. rx as per orders above; reviewed possible side effects, interactions, risks and benefits  3. Recommend supportive treatment with otc flonase  4. Follow-up prn if symptoms worsen or don't improve   Payton Mccallum, MD 06/13/16 1249

## 2016-06-13 NOTE — ED Triage Notes (Signed)
Patient c/o cough, congestion for 3-4 days. She has been taking Claritin OTC.

## 2017-08-29 ENCOUNTER — Ambulatory Visit: Payer: Medicare Other | Attending: Neurology

## 2017-08-29 DIAGNOSIS — R0683 Snoring: Secondary | ICD-10-CM | POA: Diagnosis present

## 2017-08-29 DIAGNOSIS — R413 Other amnesia: Secondary | ICD-10-CM | POA: Diagnosis present

## 2018-06-17 ENCOUNTER — Ambulatory Visit (INDEPENDENT_AMBULATORY_CARE_PROVIDER_SITE_OTHER): Payer: Medicare Other

## 2018-06-17 VITALS — BP 116/60 | HR 65 | Temp 98.4°F | Ht 64.0 in | Wt 184.7 lb

## 2018-06-17 DIAGNOSIS — Z23 Encounter for immunization: Secondary | ICD-10-CM

## 2018-06-17 DIAGNOSIS — Z1211 Encounter for screening for malignant neoplasm of colon: Secondary | ICD-10-CM | POA: Diagnosis not present

## 2018-06-17 DIAGNOSIS — E2839 Other primary ovarian failure: Secondary | ICD-10-CM | POA: Diagnosis not present

## 2018-06-17 DIAGNOSIS — Z1231 Encounter for screening mammogram for malignant neoplasm of breast: Secondary | ICD-10-CM

## 2018-06-17 DIAGNOSIS — Z0000 Encounter for general adult medical examination without abnormal findings: Secondary | ICD-10-CM | POA: Diagnosis not present

## 2018-06-17 DIAGNOSIS — Z1239 Encounter for other screening for malignant neoplasm of breast: Secondary | ICD-10-CM

## 2018-06-17 NOTE — Patient Instructions (Signed)
Stephanie Sexton , Thank you for taking time to come for your Medicare Wellness Visit. I appreciate your ongoing commitment to your health goals. Please review the following plan we discussed and let me know if I can assist you in the future.   Screening recommendations/referrals: Colorectal Screening: You will receive a call from our office regarding your appointment Mammogram: Please call to schedule your appointment Bone Density: Please call to schedule your appointment  Vision and Dental Exams: Please call to schedule your appointment for an updated eye exam Recommended annual dental exams for proper oral hygiene  Vaccinations: Influenza vaccine: Completed today Pneumococcal vaccine: Up to date Tdap vaccine: Up to date Shingles vaccine: Please call your insurance company to determine your out of pocket expense for the Shingrix vaccine. You may receive this vaccine at your local pharmacy.  Advanced directives: Advance directives discussed with you today. I have provided a copy for you to complete at home and have notarized. Once this is complete please bring a copy in to our office so we can scan it into your chart.  Goals: Recommend to begin DASH diet as directed below  Next appointment: Please schedule your Annual Wellness Visit with your Nurse Health Advisor in one year.  Preventive Care 35 Years and Older, Female Preventive care refers to lifestyle choices and visits with your health care provider that can promote health and wellness. What does preventive care include?  A yearly physical exam. This is also called an annual well check.  Dental exams once or twice a year.  Routine eye exams. Ask your health care provider how often you should have your eyes checked.  Personal lifestyle choices, including:  Daily care of your teeth and gums.  Regular physical activity.  Eating a healthy diet.  Avoiding tobacco and drug use.  Limiting alcohol use.  Practicing safe  sex.  Taking low-dose aspirin every day if recommended by your health care provider.  Taking vitamin and mineral supplements as recommended by your health care provider. What happens during an annual well check? The services and screenings done by your health care provider during your annual well check will depend on your age, overall health, lifestyle risk factors, and family history of disease. Counseling  Your health care provider may ask you questions about your:  Alcohol use.  Tobacco use.  Drug use.  Emotional well-being.  Home and relationship well-being.  Sexual activity.  Eating habits.  History of falls.  Memory and ability to understand (cognition).  Work and work Astronomer.  Reproductive health. Screening  You may have the following tests or measurements:  Height, weight, and BMI.  Blood pressure.  Lipid and cholesterol levels. These may be checked every 5 years, or more frequently if you are over 54 years old.  Skin check.  Lung cancer screening. You may have this screening every year starting at age 76 if you have a 30-pack-year history of smoking and currently smoke or have quit within the past 15 years.  Fecal occult blood test (FOBT) of the stool. You may have this test every year starting at age 11.  Flexible sigmoidoscopy or colonoscopy. You may have a sigmoidoscopy every 5 years or a colonoscopy every 10 years starting at age 30.  Hepatitis C blood test.  Hepatitis B blood test.  Sexually transmitted disease (STD) testing.  Diabetes screening. This is done by checking your blood sugar (glucose) after you have not eaten for a while (fasting). You may have this done every 1-3  years.  Bone density scan. This is done to screen for osteoporosis. You may have this done starting at age 87.  Mammogram. This may be done every 1-2 years. Talk to your health care provider about how often you should have regular mammograms. Talk with your health  care provider about your test results, treatment options, and if necessary, the need for more tests. Vaccines  Your health care provider may recommend certain vaccines, such as:  Influenza vaccine. This is recommended every year.  Tetanus, diphtheria, and acellular pertussis (Tdap, Td) vaccine. You may need a Td booster every 10 years.  Zoster vaccine. You may need this after age 50.  Pneumococcal 13-valent conjugate (PCV13) vaccine. One dose is recommended after age 53.  Pneumococcal polysaccharide (PPSV23) vaccine. One dose is recommended after age 21. Talk to your health care provider about which screenings and vaccines you need and how often you need them. This information is not intended to replace advice given to you by your health care provider. Make sure you discuss any questions you have with your health care provider. Document Released: 10/07/2015 Document Revised: 05/30/2016 Document Reviewed: 07/12/2015 Elsevier Interactive Patient Education  2017 [place] Prevention in the Home Falls can cause injuries. They can happen to people of all ages. There are many things you can do to make your home safe and to help prevent falls. What can I do on the outside of my home?  Regularly fix the edges of walkways and driveways and fix any cracks.  Remove anything that might make you trip as you walk through a door, such as a raised step or threshold.  Trim any bushes or trees on the path to your home.  Use bright outdoor lighting.  Clear any walking paths of anything that might make someone trip, such as rocks or tools.  Regularly check to see if handrails are loose or broken. Make sure that both sides of any steps have handrails.  Any raised decks and porches should have guardrails on the edges.  Have any leaves, snow, or ice cleared regularly.  Use sand or salt on walking paths during winter.  Clean up any spills in your garage right away. This includes oil or  grease spills. What can I do in the bathroom?  Use night lights.  Install grab bars by the toilet and in the tub and shower. Do not use towel bars as grab bars.  Use non-skid mats or decals in the tub or shower.  If you need to sit down in the shower, use a plastic, non-slip stool.  Keep the floor dry. Clean up any water that spills on the floor as soon as it happens.  Remove soap buildup in the tub or shower regularly.  Attach bath mats securely with double-sided non-slip rug tape.  Do not have throw rugs and other things on the floor that can make you trip. What can I do in the bedroom?  Use night lights.  Make sure that you have a light by your bed that is easy to reach.  Do not use any sheets or blankets that are too big for your bed. They should not hang down onto the floor.  Have a firm chair that has side arms. You can use this for support while you get dressed.  Do not have throw rugs and other things on the floor that can make you trip. What can I do in the kitchen?  Clean up any spills right away.  Avoid walking on wet floors.  Keep items that you use a lot in easy-to-reach places.  If you need to reach something above you, use a strong step stool that has a grab bar.  Keep electrical cords out of the way.  Do not use floor polish or wax that makes floors slippery. If you must use wax, use non-skid floor wax.  Do not have throw rugs and other things on the floor that can make you trip. What can I do with my stairs?  Do not leave any items on the stairs.  Make sure that there are handrails on both sides of the stairs and use them. Fix handrails that are broken or loose. Make sure that handrails are as long as the stairways.  Check any carpeting to make sure that it is firmly attached to the stairs. Fix any carpet that is loose or worn.  Avoid having throw rugs at the top or bottom of the stairs. If you do have throw rugs, attach them to the floor with  carpet tape.  Make sure that you have a light switch at the top of the stairs and the bottom of the stairs. If you do not have them, ask someone to add them for you. What else can I do to help prevent falls?  Wear shoes that:  Do not have high heels.  Have rubber bottoms.  Are comfortable and fit you well.  Are closed at the toe. Do not wear sandals.  If you use a stepladder:  Make sure that it is fully opened. Do not climb a closed stepladder.  Make sure that both sides of the stepladder are locked into place.  Ask someone to hold it for you, if possible.  Clearly mark and make sure that you can see:  Any grab bars or handrails.  First and last steps.  Where the edge of each step is.  Use tools that help you move around (mobility aids) if they are needed. These include:  Canes.  Walkers.  Scooters.  Crutches.  Turn on the lights when you go into a dark area. Replace any light bulbs as soon as they burn out.  Set up your furniture so you have a clear path. Avoid moving your furniture around.  If any of your floors are uneven, fix them.  If there are any pets around you, be aware of where they are.  Review your medicines with your doctor. Some medicines can make you feel dizzy. This can increase your chance of falling. Ask your doctor what other things that you can do to help prevent falls. This information is not intended to replace advice given to you by your health care provider. Make sure you discuss any questions you have with your health care provider. Document Released: 07/07/2009 Document Revised: 02/16/2016 Document Reviewed: 10/15/2014  DASH Eating Plan DASH stands for "Dietary Approaches to Stop Hypertension." The DASH eating plan is a healthy eating plan that has been shown to reduce high blood pressure (hypertension). It may also reduce your risk for type 2 diabetes, heart disease, and stroke. The DASH eating plan may also help with weight  loss. What are tips for following this plan? General guidelines  Avoid eating more than 2,300 mg (milligrams) of salt (sodium) a day. If you have hypertension, you may need to reduce your sodium intake to 1,500 mg a day.  Limit alcohol intake to no more than 1 drink a day for nonpregnant women and 2 drinks a  day for men. One drink equals 12 oz of beer, 5 oz of wine, or 1 oz of hard liquor.  Work with your health care provider to maintain a healthy body weight or to lose weight. Ask what an ideal weight is for you.  Get at least 30 minutes of exercise that causes your heart to beat faster (aerobic exercise) most days of the week. Activities may include walking, swimming, or biking.  Work with your health care provider or diet and nutrition specialist (dietitian) to adjust your eating plan to your individual calorie needs. Reading food labels  Check food labels for the amount of sodium per serving. Choose foods with less than 5 percent of the Daily Value of sodium. Generally, foods with less than 300 mg of sodium per serving fit into this eating plan.  To find whole grains, look for the word "whole" as the first word in the ingredient list. Shopping  Buy products labeled as "low-sodium" or "no salt added."  Buy fresh foods. Avoid canned foods and premade or frozen meals. Cooking  Avoid adding salt when cooking. Use salt-free seasonings or herbs instead of table salt or sea salt. Check with your health care provider or pharmacist before using salt substitutes.  Do not fry foods. Cook foods using healthy methods such as baking, boiling, grilling, and broiling instead.  Cook with heart-healthy oils, such as olive, canola, soybean, or sunflower oil. Meal planning   Eat a balanced diet that includes: ? 5 or more servings of fruits and vegetables each day. At each meal, try to fill half of your plate with fruits and vegetables. ? Up to 6-8 servings of whole grains each day. ? Less than 6  oz of lean meat, poultry, or fish each day. A 3-oz serving of meat is about the same size as a deck of cards. One egg equals 1 oz. ? 2 servings of low-fat dairy each day. ? A serving of nuts, seeds, or beans 5 times each week. ? Heart-healthy fats. Healthy fats called Omega-3 fatty acids are found in foods such as flaxseeds and coldwater fish, like sardines, salmon, and mackerel.  Limit how much you eat of the following: ? Canned or prepackaged foods. ? Food that is high in trans fat, such as fried foods. ? Food that is high in saturated fat, such as fatty meat. ? Sweets, desserts, sugary drinks, and other foods with added sugar. ? Full-fat dairy products.  Do not salt foods before eating.  Try to eat at least 2 vegetarian meals each week.  Eat more home-cooked food and less restaurant, buffet, and fast food.  When eating at a restaurant, ask that your food be prepared with less salt or no salt, if possible. What foods are recommended? The items listed may not be a complete list. Talk with your dietitian about what dietary choices are best for you. Grains Whole-grain or whole-wheat bread. Whole-grain or whole-wheat pasta. Brown rice. Orpah Cobb. Bulgur. Whole-grain and low-sodium cereals. Pita bread. Low-fat, low-sodium crackers. Whole-wheat flour tortillas. Vegetables Fresh or frozen vegetables (raw, steamed, roasted, or grilled). Low-sodium or reduced-sodium tomato and vegetable juice. Low-sodium or reduced-sodium tomato sauce and tomato paste. Low-sodium or reduced-sodium canned vegetables. Fruits All fresh, dried, or frozen fruit. Canned fruit in natural juice (without added sugar). Meat and other protein foods Skinless chicken or [place]. Ground chicken or [place]. Pork with fat trimmed off. Fish and seafood. Egg whites. Dried beans, peas, or lentils. Unsalted nuts, nut butters, and seeds. Unsalted  canned beans. Lean cuts of beef with fat trimmed off. Low-sodium, lean deli  meat. Dairy Low-fat (1%) or fat-free (skim) milk. Fat-free, low-fat, or reduced-fat cheeses. Nonfat, low-sodium ricotta or cottage cheese. Low-fat or nonfat yogurt. Low-fat, low-sodium cheese. Fats and oils Soft margarine without trans fats. Vegetable oil. Low-fat, reduced-fat, or light mayonnaise and salad dressings (reduced-sodium). Canola, safflower, olive, soybean, and sunflower oils. Avocado. Seasoning and other foods Herbs. Spices. Seasoning mixes without salt. Unsalted popcorn and pretzels. Fat-free sweets. What foods are not recommended? The items listed may not be a complete list. Talk with your dietitian about what dietary choices are best for you. Grains Baked goods made with fat, such as croissants, muffins, or some breads. Dry pasta or rice meal packs. Vegetables Creamed or fried vegetables. Vegetables in a cheese sauce. Regular canned vegetables (not low-sodium or reduced-sodium). Regular canned tomato sauce and paste (not low-sodium or reduced-sodium). Regular tomato and vegetable juice (not low-sodium or reduced-sodium). Rosita Fire. Olives. Fruits Canned fruit in a light or heavy syrup. Fried fruit. Fruit in cream or butter sauce. Meat and other protein foods Fatty cuts of meat. Ribs. Fried meat. Tomasa Blase. Sausage. Bologna and other processed lunch meats. Salami. Fatback. Hotdogs. Bratwurst. Salted nuts and seeds. Canned beans with added salt. Canned or smoked fish. Whole eggs or egg yolks. Chicken or [place] with skin. Dairy Whole or 2% milk, cream, and half-and-half. Whole or full-fat cream cheese. Whole-fat or sweetened yogurt. Full-fat cheese. Nondairy creamers. Whipped toppings. Processed cheese and cheese spreads. Fats and oils Butter. Stick margarine. Lard. Shortening. Ghee. Bacon fat. Tropical oils, such as coconut, palm kernel, or palm oil. Seasoning and other foods Salted popcorn and pretzels. Onion salt, garlic salt, seasoned salt, table salt, and sea salt. Worcestershire  sauce. Tartar sauce. Barbecue sauce. Teriyaki sauce. Soy sauce, including reduced-sodium. Steak sauce. Canned and packaged gravies. Fish sauce. Oyster sauce. Cocktail sauce. Horseradish that you find on the shelf. Ketchup. Mustard. Meat flavorings and tenderizers. Bouillon cubes. Hot sauce and Tabasco sauce. Premade or packaged marinades. Premade or packaged taco seasonings. Relishes. Regular salad dressings. Where to find more information:  National Heart, Lung, and Blood Institute: PopSteam.is  American Heart Association: www.heart.org Summary  The DASH eating plan is a healthy eating plan that has been shown to reduce high blood pressure (hypertension). It may also reduce your risk for type 2 diabetes, heart disease, and stroke.  With the DASH eating plan, you should limit salt (sodium) intake to 2,300 mg a day. If you have hypertension, you may need to reduce your sodium intake to 1,500 mg a day.  When on the DASH eating plan, aim to eat more fresh fruits and vegetables, whole grains, lean proteins, low-fat dairy, and heart-healthy fats.  Work with your health care provider or diet and nutrition specialist (dietitian) to adjust your eating plan to your individual calorie needs. This information is not intended to replace advice given to you by your health care provider. Make sure you discuss any questions you have with your health care provider. Document Released: 08/30/2011 Document Revised: 09/03/2016 Document Reviewed: 09/03/2016 Elsevier Interactive Patient Education  2018 [employer].  Risk analyst Patient Education  [employer].

## 2018-06-17 NOTE — Progress Notes (Signed)
Subjective:   Stephanie Sexton is a 67 y.o. female who presents for an Initial Medicare Annual Wellness Visit.  Review of Systems    N/A  Cardiac Risk Factors include: advanced age (>47men, >9 women);dyslipidemia;hypertension;obesity (BMI >30kg/m2);sedentary lifestyle     Objective:    Today's Vitals   06/17/18 1033  BP: 116/60  Pulse: 65  Temp: 98.4 F (36.9 C)  TempSrc: Oral  SpO2: 96%  Weight: 184 lb 11.2 oz (83.8 kg)  Height: 5\' 4"  (1.626 m)   Body mass index is 31.7 kg/m.  Advanced Directives 06/17/2018  Does Patient Have a Medical Advance Directive? No  Would patient like information on creating a medical advance directive? Yes (MAU/Ambulatory/Procedural Areas - Information given)    Current Medications (verified) Outpatient Encounter Medications as of 06/17/2018  Medication Sig  . Apoaequorin (PREVAGEN) 10 MG CAPS Take by mouth daily.  . Ascorbic Acid (VITAMIN C) 100 MG tablet Take 500 mg by mouth daily.   Marland Kitchen aspirin EC 81 MG tablet Take 81 mg by mouth daily.  Marland Kitchen BIOTIN 5000 PO Take 5,000 mg by mouth daily.  . Cholecalciferol (VITAMIN D3 ADULT GUMMIES PO) Take by mouth daily.  Marland Kitchen levETIRAcetam (KEPPRA) 750 MG tablet Take 750 mg by mouth 2 (two) times daily.  . Misc Natural Products (TURMERIC CURCUMIN) CAPS Take 1 capsule by mouth daily.  . Omega-3 Fatty Acids (FISH OIL) 1000 MG CAPS Take by mouth 2 (two) times daily.   . vitamin B-12 (CYANOCOBALAMIN) 100 MCG tablet Take 1,000 mcg by mouth 2 (two) times daily.   Marland Kitchen amoxicillin (AMOXIL) 875 MG tablet Take 1 tablet (875 mg total) by mouth 2 (two) times daily.  Clinical research associate Bandages & Supports (WRIST BRACE/LEFT MEDIUM) MISC Wear brace on the left wrist/hand daily while awake   No facility-administered encounter medications on file as of 06/17/2018.     Allergies (verified) Sulfa antibiotics   History: Past Medical History:  Diagnosis Date  . Facial injury   . Fibrocystic breast changes   . GERD (gastroesophageal  reflux disease)   . Hiatal hernia   . Hyperlipidemia   . Seizures (HCC)    managed by Dr. Sherryll Sexton  . Sensorineural hearing loss of both ears    Past Surgical History:  Procedure Laterality Date  . ABDOMINAL HYSTERECTOMY     Total  . BREAST LUMPECTOMY Left   . CATARACT EXTRACTION, BILATERAL    . COLONOSCOPY W/ POLYPECTOMY  2006   Tubular Adenoma    Family History  Problem Relation Age of Onset  . Diabetes Mother   . Thyroid disease Mother   . Hypertension Mother   . Glaucoma Father   . Heart disease Brother   . Diabetes Brother   . Hypertension Brother   . Cancer Sister        colon and breast  . Thyroid disease Sister   . Seizures Paternal Aunt    Social History   Socioeconomic History  . Marital status: Widowed    Spouse name: Not on file  . Number of children: 1  . Years of education: Not on file  . Highest education level: Bachelor's degree (e.g., BA, AB, BS)  Occupational History  . Occupation: Retired  Engineer, production  . Financial resource strain: Not hard at all  . Food insecurity:    Worry: Never true    Inability: Never true  . Transportation needs:    Medical: No    Non-medical: No  Tobacco Use  .  Smoking status: Never Smoker  . Smokeless tobacco: Never Used  . Tobacco comment: smoking cessation materials not required  Substance and Sexual Activity  . Alcohol use: No  . Drug use: No  . Sexual activity: Never    Birth control/protection: Surgical  Lifestyle  . Physical activity:    Days per week: 0 days    Minutes per session: 0 min  . Stress: Not at all  Relationships  . Social connections:    Talks on phone: Patient refused    Gets together: Patient refused    Attends religious service: Patient refused    Active member of club or organization: Patient refused    Attends meetings of clubs or organizations: Patient refused    Relationship status: Widowed  Other Topics Concern  . Not on file  Social History Narrative  . Not on file     Tobacco Counseling Counseling given: No Comment: smoking cessation materials not required  Clinical Intake:  Pre-visit preparation completed: Yes  Pain : No/denies pain   BMI - recorded: 31.7 Nutritional Status: BMI > 30  Obese Nutritional Risks: None Diabetes: No  How often do you need to have someone help you when you read instructions, pamphlets, or other written materials from your doctor or pharmacy?: 1 - Never  Interpreter Needed?: No  Information entered by :: AEversole, LPN   Activities of Daily Living In your present state of health, do you have any difficulty performing the following activities: 06/17/2018  Hearing? N  Comment denies hearing aids  Vision? N  Comment wears eyeglasses  Difficulty concentrating or making decisions? Y  Comment short term memory loss  Walking or climbing stairs? N  Dressing or bathing? N  Doing errands, shopping? N  Preparing Food and eating ? N  Comment denies dentures  Using the Toilet? N  In the past six months, have you accidently leaked urine? N  Do you have problems with loss of bowel control? N  Managing your Medications? N  Managing your Finances? N  Housekeeping or managing your Housekeeping? N  Some recent data might be hidden     Immunizations and Health Maintenance Immunization History  Administered Date(s) Administered  . Influenza, High Dose Seasonal PF 06/17/2018  . Influenza, Seasonal, Injecte, Preservative Fre 06/06/2015  . Influenza-Unspecified 08/13/2014  . Pneumococcal Conjugate-13 08/31/2014  . Pneumococcal Polysaccharide-23 12/07/2015  . Td 06/08/2003  . Tdap 08/13/2014  . Zoster 06/20/2010   Health Maintenance Due  Topic Date Due  . Hepatitis C Screening  1951-01-10  . COLONOSCOPY  11/28/2009  . MAMMOGRAM  12/02/2013    Patient Care Team: Stephanie Passey, MD as PCP - General (Family Medicine) Stephanie Face, MD as Consulting Physician (Neurology)  Indicate any recent Medical Services  you may have received from other than [facility] providers in the past year (date may be approximate).     Assessment:   This is a routine wellness examination for Stephanie Sexton.  Hearing/Vision screen Vision Screening Comments: Sees [facility] for [facility]  Dietary issues and exercise activities discussed: Current Exercise Habits: The patient does not participate in regular exercise at present, Exercise limited by: None identified  Goals    . DIET - EAT MORE FRUITS AND VEGETABLES     Recommend to decrease portion sizes by eating 3 small healthy meals and at least 2 healthy snacks per day.      Depression Screen PHQ 2/9 Scores 06/17/2018  PHQ - 2 Score 0  PHQ- 9 Score 0    Fall Risk Fall Risk  06/17/2018  Falls in the past year? No  Risk for fall due to : Impaired vision  Risk for fall due to: Comment wears eyeglasses    FALL RISK PREVENTION PERTAINING TO THE HOME:  Any stairs in or around the home WITH handrails? Yes  Home free of loose throw rugs in walkways, pet beds, electrical cords, etc? Yes  Adequate lighting in your home to reduce risk of falls? Yes   ASSISTIVE DEVICES UTILIZED TO PREVENT FALLS:  Life alert? No  Use of a cane, walker or w/c? No  Grab bars in the bathroom? No  Shower chair or bench in shower? No  Elevated toilet seat or a handicapped toilet? No   DME ORDER AND COMMUNITY RESOURCE REFERRAL:  Does the patient want an order for a shower chair or an elevated toilet seat?  No  Does the patient want a Publishing rights managerCommunity Resource Referral sent to the Care Guide for a life alert or installation of grab bars in the shower?  No   TIMED UP AND GO:  Was the test performed? Yes .  Length of time to ambulate 10 feet: 12 sec.   GAIT:  Describe appearance of gait: Gait slow, steady and without the use of an assistive device.  Education: Fall risk prevention has been discussed.  Are interventions required? No   Cognitive Function:     6CIT Screen 06/17/2018   What Year? 0 points  What month? 0 points  What time? 0 points  Count back from 20 0 points  Months in reverse 0 points  Repeat phrase 4 points  Total Score 4    Screening Tests Health Maintenance  Topic Date Due  . Hepatitis C Screening  03-10-1951  . COLONOSCOPY  11/28/2009  . MAMMOGRAM  12/02/2013  . TETANUS/TDAP  08/13/2024  . INFLUENZA VACCINE  Completed  . DEXA SCAN  Completed  . PNA vac Low Risk Adult  Completed    Qualifies for Shingles Vaccine? Yes  Zostavax completed 06/20/10. Due for Shingrix. Education has been provided regarding the importance of this vaccine. Pt has been advised to call insurance company to determine out of pocket expense. Advised may also receive vaccine at local pharmacy or Health Dept. Verbalized acceptance and understanding.  Cancer Screenings:  Colorectal Screening: Completed 11/22/04. Repeat every 5 years. Referral to GI placed today. Pt aware that she will receive a call re: appointment.  Mammogram: Completed 12/02/12. Repeat every year. Ordered today. Pt provided with contact info and advised to call to schedule appt.   Bone Density: Completed 08/18/13. Results reflect NORMAL. Repeat every 2 years. Ordered today. Pt provided with contact info and advised to call to schedule appt.   Lung Cancer Screening: (Low Dose CT Chest recommended if Age 28-80 years, 30 pack-year currently smoking OR have quit w/in 15years.) does not qualify.   Additional Screening:  Hepatitis C Screening: does qualify but declined  Vision Screening: Recommended annual ophthalmology exams for early detection of glaucoma and other disorders of the eye. Is the patient up to date with their annual eye exam?  No. Pt declined my offer to place a referral for her to have an updated eye exam. Who is the provider or what is the name of the office in which the pt attends annual eye exams? [facility]  If pt is not established with a provider, would they like to be  referred to a provider to  establish care? No .   Dental Screening: Recommended annual dental exams for proper oral hygiene   Plan:  I have personally reviewed and addressed the Medicare Annual Wellness questionnaire and have noted the following in the patient's chart:  A. Medical and social history B. Use of alcohol, tobacco or illicit drugs  C. Current medications and supplements D. Functional ability and status E.  Nutritional status F.  Physical activity G. Advance directives H. List of other physicians I.  Hospitalizations, surgeries, and ER visits in previous 12 months J.  Vitals K. Screenings such as hearing and vision if needed, cognitive and depression L. Referrals and appointments  In addition, I have reviewed and discussed with patient certain preventive protocols, quality metrics, and best practice recommendations. A written personalized care plan for preventive services as well as general preventive health recommendations were provided to patient.  See attached scanned questionnaire for additional information.   Signed,  Deon Pilling, LPN Nurse Health Advisor

## 2018-07-16 ENCOUNTER — Encounter: Payer: Self-pay | Admitting: *Deleted

## 2019-04-02 ENCOUNTER — Other Ambulatory Visit: Payer: Self-pay | Admitting: Neurology

## 2019-04-02 ENCOUNTER — Other Ambulatory Visit (HOSPITAL_COMMUNITY): Payer: Self-pay | Admitting: Neurology

## 2019-04-02 DIAGNOSIS — G3184 Mild cognitive impairment, so stated: Secondary | ICD-10-CM

## 2019-04-10 ENCOUNTER — Other Ambulatory Visit: Payer: Self-pay

## 2019-04-10 ENCOUNTER — Ambulatory Visit (HOSPITAL_COMMUNITY)
Admission: RE | Admit: 2019-04-10 | Discharge: 2019-04-10 | Disposition: A | Discharge status: Home / Self Care | Payer: Medicare Other | Source: Ambulatory Visit | Attending: Neurology | Admitting: Neurology

## 2019-04-10 DIAGNOSIS — G3184 Mild cognitive impairment, so stated: Secondary | ICD-10-CM | POA: Diagnosis not present

## 2019-06-25 ENCOUNTER — Ambulatory Visit: Payer: Medicare Other

## 2019-08-26 IMAGING — MR MRI HEAD WITHOUT CONTRAST
3 series · 48 of 48 positions shown · non-contrast
Comparison: None.

CLINICAL DATA: Worsening memory loss.

EXAM:
MRI HEAD WITHOUT CONTRAST
TECHNIQUE: Multiplanar, multiecho pulse sequences of the brain and surrounding
structures were obtained without intravenous contrast.

[Series 52: nqsegcb_sc_cor · 1.00mm/px · 17 of 241 slices shown]
[im 1/241]
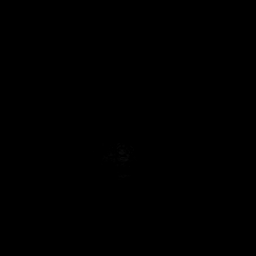
[im 16/241]
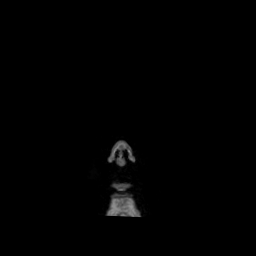
[im 31/241]
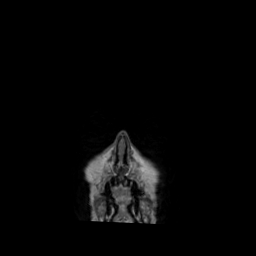
[im 46/241]
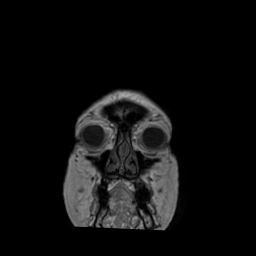
[im 61/241]
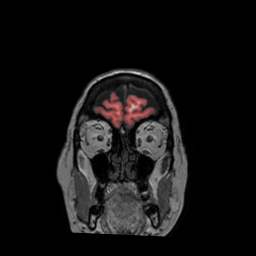
[im 76/241]
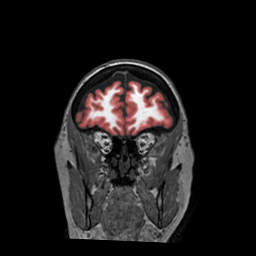
[im 91/241]
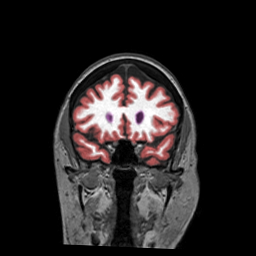
[im 106/241]
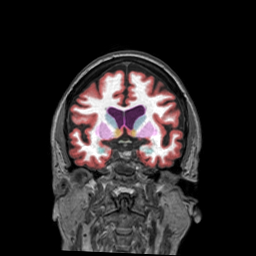
[im 121/241]
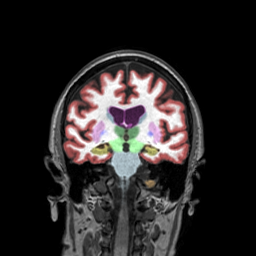
[im 136/241]
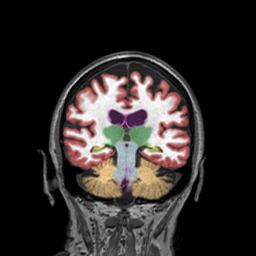
[im 151/241]
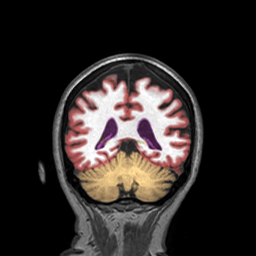
[im 166/241]
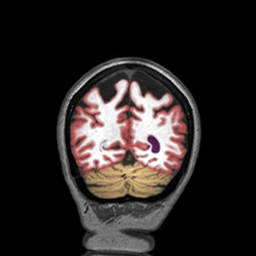
[im 181/241]
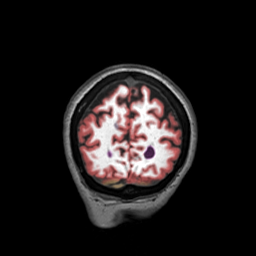
[im 196/241]
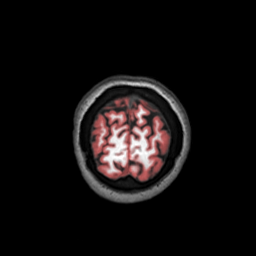
[im 211/241]
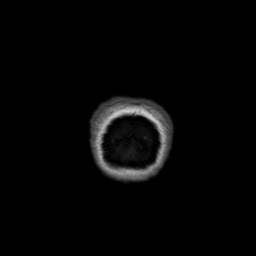
[im 226/241]
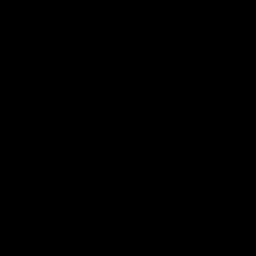
[im 241/241]
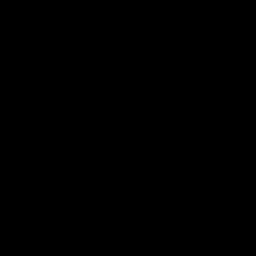

[Series 53: nqsegcb_sc_axl · 1.00mm/px · 16 of 218 slices shown]
[im 1/218]
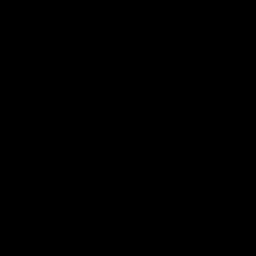
[im 15/218]
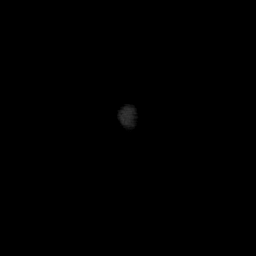
[im 29/218]
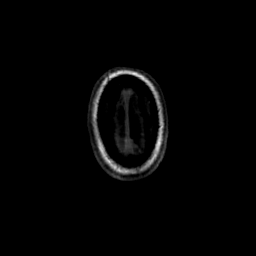
[im 44/218]
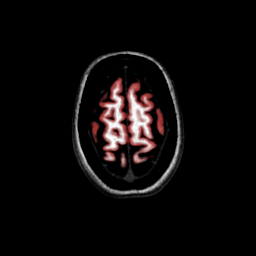
[im 58/218]
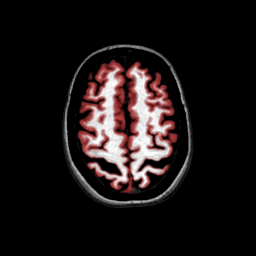
[im 73/218]
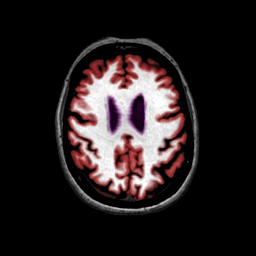
[im 87/218]
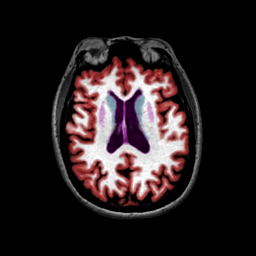
[im 102/218]
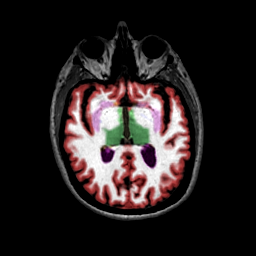
[im 116/218]
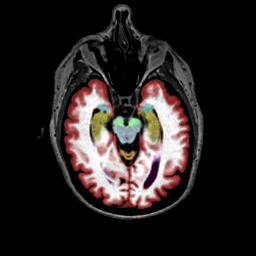
[im 131/218]
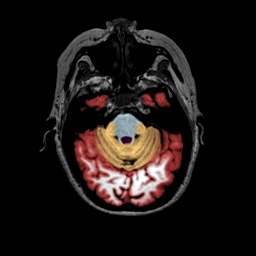
[im 145/218]
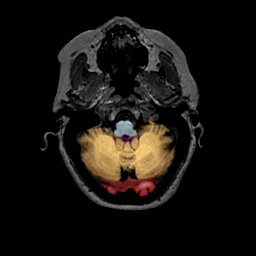
[im 160/218]
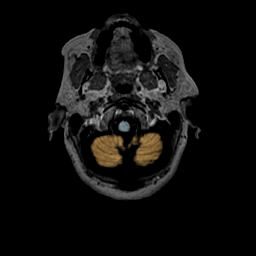
[im 174/218]
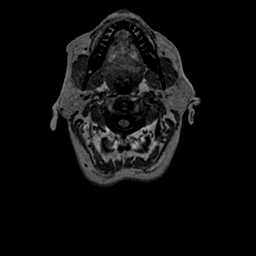
[im 189/218]
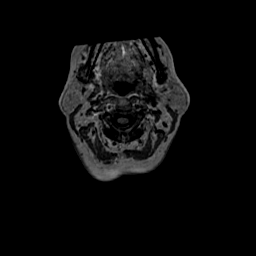
[im 203/218]
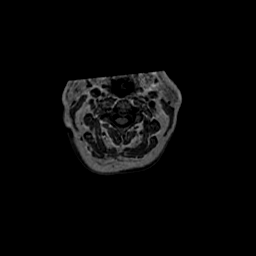
[im 218/218]
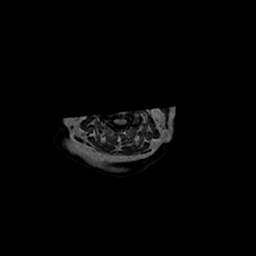

[Series 54: nqsegcb_sc_sag · 1.00mm/px · 15 of 204 slices shown]
[im 1/204]
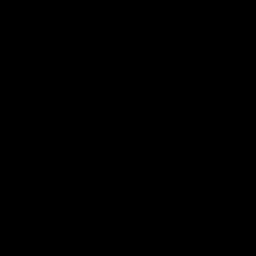
[im 15/204]
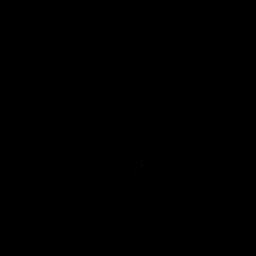
[im 30/204]
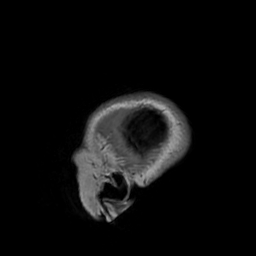
[im 44/204]
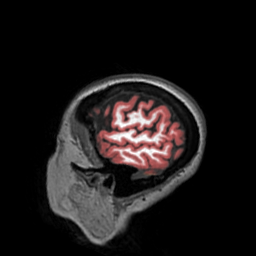
[im 59/204]
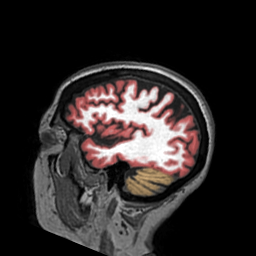
[im 73/204]
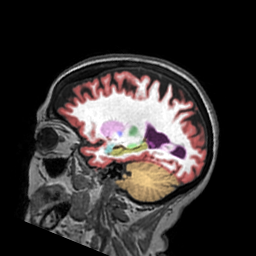
[im 88/204]
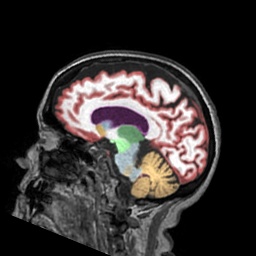
[im 102/204]
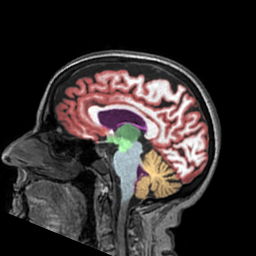
[im 117/204]
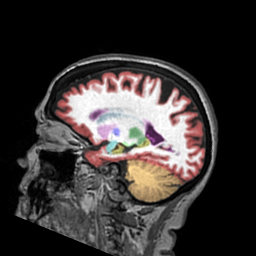
[im 131/204]
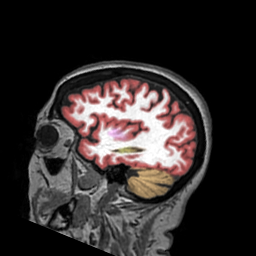
[im 146/204]
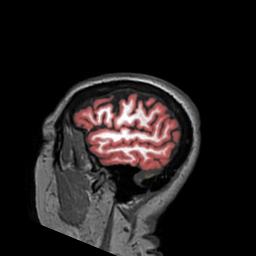
[im 160/204]
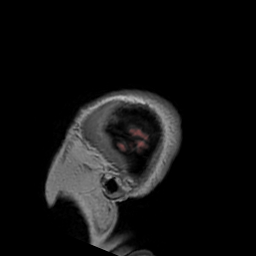
[im 175/204]
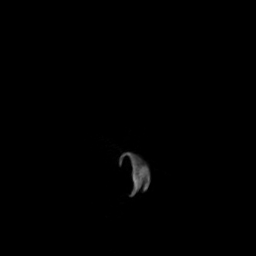
[im 189/204]
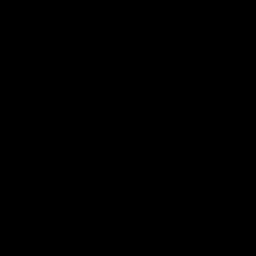
[im 204/204]
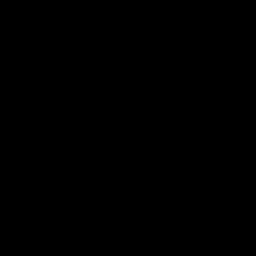

[48 of 48 positions shown; findings below may reference images not displayed]

FINDINGS: Brain: Diffusion imaging does not show any acute or subacute
infarction. The brainstem and cerebellum are normal. Cerebral
hemispheres show volume loss, somewhat parietal predominant without
a pattern of significant small-vessel disease. No cortical or large
vessel territory infarction. No mass lesion, hemorrhage,
hydrocephalus or extra-axial collection.

Vascular: Major vessels at the base of the brain show flow.

Skull and upper cervical spine: Negative

Sinuses/Orbits: Retention cyst of the sphenoid sinus. Other sinuses
are clear. Orbits negative.

Other: None
IMPRESSION: No acute or reversible finding. Mild volume loss, somewhat parietal
predominant. No evidence of small vessel disease or other focal
brain insult.

## 2021-06-30 DIAGNOSIS — Z23 Encounter for immunization: Secondary | ICD-10-CM | POA: Diagnosis not present

## 2022-06-28 ENCOUNTER — Telehealth: Payer: Self-pay

## 2022-06-28 NOTE — Telephone Encounter (Signed)
Spoke with patient's daughter Racine and scheduled a Mychart Palliative Consult for 07/05/22 @ 1:30 PM.  Consent obtained; updated Netsmart, Team List and Epic.

## 2022-07-05 ENCOUNTER — Telehealth: Payer: Self-pay | Admitting: Primary Care

## 2022-07-19 ENCOUNTER — Telehealth: Payer: Self-pay | Admitting: Primary Care

## 2022-07-23 ENCOUNTER — Telehealth: Payer: Self-pay

## 2022-07-23 NOTE — Telephone Encounter (Signed)
Attempted to contact patient's daughter Gerst to reschedule a Palliative Care consult appointment. No answer left a message to return call.

## 2022-07-23 NOTE — Telephone Encounter (Signed)
Spoke with patient's daughter [name] regarding rescheduling Palliative Care consult. She declined rescheduling d/t Palliative Care's new criteria. Patient lives in [place] so [place] would only be available virtually. Glasby states she will reach out to patient's PCP for further recommendations. Will close referral for Palliative Care with [facility].
